# Patient Record
Sex: Male | Born: 1935 | Race: White | Hispanic: No | Marital: Married | Smoking: Never smoker
Health system: Southern US, Community
[De-identification: ages and names within clinical notes are randomized; demographics above are authoritative.]

## PROBLEM LIST (undated history)

## (undated) DIAGNOSIS — I10 Essential (primary) hypertension: Secondary | ICD-10-CM

## (undated) HISTORY — PX: OTHER SURGICAL HISTORY: SHX169

## (undated) HISTORY — PX: HERNIA REPAIR: SHX51

## (undated) HISTORY — DX: Essential (primary) hypertension: I10

## (undated) HISTORY — PX: KIDNEY STONE SURGERY: SHX686

## (undated) HISTORY — PX: HEMORROIDECTOMY: SUR656

---

## 2015-11-08 ENCOUNTER — Encounter (HOSPITAL_BASED_OUTPATIENT_CLINIC_OR_DEPARTMENT_OTHER): Payer: Self-pay | Admitting: *Deleted

## 2015-11-08 ENCOUNTER — Emergency Department (HOSPITAL_BASED_OUTPATIENT_CLINIC_OR_DEPARTMENT_OTHER): Payer: Medicare Other

## 2015-11-08 ENCOUNTER — Emergency Department (HOSPITAL_BASED_OUTPATIENT_CLINIC_OR_DEPARTMENT_OTHER)
Admission: EM | Admit: 2015-11-08 | Discharge: 2015-11-08 | Disposition: A | Discharge status: Home / Self Care | Payer: Medicare Other | Attending: Emergency Medicine | Admitting: Emergency Medicine

## 2015-11-08 DIAGNOSIS — N200 Calculus of kidney: Secondary | ICD-10-CM | POA: Insufficient documentation

## 2015-11-08 DIAGNOSIS — R1030 Lower abdominal pain, unspecified: Secondary | ICD-10-CM | POA: Diagnosis present

## 2015-11-08 LAB — LIPASE, BLOOD: Lipase: 24 U/L (ref 11–51)

## 2015-11-08 LAB — URINE MICROSCOPIC-ADD ON

## 2015-11-08 LAB — COMPREHENSIVE METABOLIC PANEL
ALT: 23 U/L (ref 17–63)
ANION GAP: 7 (ref 5–15)
AST: 27 U/L (ref 15–41)
Albumin: 4.3 g/dL (ref 3.5–5.0)
Alkaline Phosphatase: 60 U/L (ref 38–126)
BILIRUBIN TOTAL: 1.1 mg/dL (ref 0.3–1.2)
BUN: 20 mg/dL (ref 6–20)
CO2: 26 mmol/L (ref 22–32)
Calcium: 9.2 mg/dL (ref 8.9–10.3)
Chloride: 105 mmol/L (ref 101–111)
Creatinine, Ser: 1.07 mg/dL (ref 0.61–1.24)
GLUCOSE: 127 mg/dL — AB (ref 65–99)
POTASSIUM: 3.8 mmol/L (ref 3.5–5.1)
Sodium: 138 mmol/L (ref 135–145)
TOTAL PROTEIN: 7.4 g/dL (ref 6.5–8.1)

## 2015-11-08 LAB — URINALYSIS, ROUTINE W REFLEX MICROSCOPIC
Bilirubin Urine: NEGATIVE
Glucose, UA: NEGATIVE mg/dL
Hgb urine dipstick: NEGATIVE
Ketones, ur: 15 mg/dL — AB
LEUKOCYTES UA: NEGATIVE
NITRITE: NEGATIVE
PH: 5.5 (ref 5.0–8.0)
Protein, ur: 30 mg/dL — AB
SPECIFIC GRAVITY, URINE: 1.028 (ref 1.005–1.030)

## 2015-11-08 LAB — CBC WITH DIFFERENTIAL/PLATELET
BASOS ABS: 0 10*3/uL (ref 0.0–0.1)
BASOS PCT: 0 %
EOS ABS: 0 10*3/uL (ref 0.0–0.7)
Eosinophils Relative: 0 %
HEMATOCRIT: 43.6 % (ref 39.0–52.0)
HEMOGLOBIN: 15.8 g/dL (ref 13.0–17.0)
Lymphocytes Relative: 5 %
Lymphs Abs: 0.7 10*3/uL (ref 0.7–4.0)
MCH: 31.1 pg (ref 26.0–34.0)
MCHC: 36.2 g/dL — AB (ref 30.0–36.0)
MCV: 85.8 fL (ref 78.0–100.0)
MONOS PCT: 3 %
Monocytes Absolute: 0.5 10*3/uL (ref 0.1–1.0)
NEUTROS ABS: 14 10*3/uL — AB (ref 1.7–7.7)
NEUTROS PCT: 92 %
Platelets: 272 10*3/uL (ref 150–400)
RBC: 5.08 MIL/uL (ref 4.22–5.81)
RDW: 13.5 % (ref 11.5–15.5)
WBC: 15.2 10*3/uL — AB (ref 4.0–10.5)

## 2015-11-08 MED ORDER — SODIUM CHLORIDE 0.9 % IV SOLN
INTRAVENOUS | Status: DC
  Administered 2015-11-08: 10:00:00 via INTRAVENOUS

## 2015-11-08 MED ORDER — METHYLPREDNISOLONE SODIUM SUCC 125 MG IJ SOLR
125.0000 mg | Freq: Once | INTRAMUSCULAR | Status: AC
  Administered 2015-11-08: 125 mg via INTRAVENOUS
  Filled 2015-11-08: qty 2

## 2015-11-08 MED ORDER — IOPAMIDOL (ISOVUE-300) INJECTION 61%
100.0000 mL | Freq: Once | INTRAVENOUS | Status: AC | PRN
  Administered 2015-11-08: 100 mL via INTRAVENOUS

## 2015-11-08 MED ORDER — HYDROCODONE-ACETAMINOPHEN 5-325 MG PO TABS: 1.0000 | ORAL_TABLET | ORAL | Status: DC | PRN

## 2015-11-08 MED ORDER — DIPHENHYDRAMINE HCL 50 MG/ML IJ SOLN
25.0000 mg | Freq: Once | INTRAMUSCULAR | Status: AC
  Administered 2015-11-08: 25 mg via INTRAVENOUS
  Filled 2015-11-08: qty 1

## 2015-11-08 NOTE — Discharge Instructions (Signed)
Dietary Guidelines to Help Prevent Kidney Stones °Your risk of kidney stones can be decreased by adjusting the foods you eat. The most important thing you can do is drink enough fluid. You should drink enough fluid to keep your urine clear or pale yellow. The following guidelines provide specific information for the type of kidney stone you have had. °GUIDELINES ACCORDING TO TYPE OF KIDNEY STONE °Calcium Oxalate Kidney Stones °· Reduce the amount of salt you eat. Foods that have a lot of salt cause your body to release excess calcium into your urine. The excess calcium can combine with a substance called oxalate to form kidney stones. °· Reduce the amount of animal protein you eat if the amount you eat is excessive. Animal protein causes your body to release excess calcium into your urine. Ask your dietitian how much protein from animal sources you should be eating. °· Avoid foods that are high in oxalates. If you take vitamins, they should have less than 500 mg of vitamin C. Your body turns vitamin C into oxalates. You do not need to avoid fruits and vegetables high in vitamin C. °Calcium Phosphate Kidney Stones °· Reduce the amount of salt you eat to help prevent the release of excess calcium into your urine. °· Reduce the amount of animal protein you eat if the amount you eat is excessive. Animal protein causes your body to release excess calcium into your urine. Ask your dietitian how much protein from animal sources you should be eating. °· Get enough calcium from food or take a calcium supplement (ask your dietitian for recommendations). Food sources of calcium that do not increase your risk of kidney stones include: °¨ Broccoli. °¨ Dairy products, such as cheese and yogurt. °¨ Pudding. °Uric Acid Kidney Stones °· Do not have more than 6 oz of animal protein per day. °FOOD SOURCES °Animal Protein Sources °· Meat (all types). °· Poultry. °· Eggs. °· Fish, seafood. °Foods High in Salt °· Salt seasonings. °· Soy  sauce. °· Teriyaki sauce. °· Cured and processed meats. °· Salted crackers and snack foods. °· Fast food. °· Canned soups and most canned foods. °Foods High in Oxalates °· Grains: °¨ Amaranth. °¨ Barley. °¨ Grits. °¨ Wheat germ. °¨ Bran. °¨ Buckwheat flour. °¨ All bran cereals. °¨ Pretzels. °¨ Whole wheat bread. °· Vegetables: °¨ Beans (wax). °¨ Beets and beet greens. °¨ Collard greens. °¨ Eggplant. °¨ Escarole. °¨ Leeks. °¨ Okra. °¨ Parsley. °¨ Rutabagas. °¨ Spinach. °¨ Swiss chard. °¨ Tomato paste. °¨ Fried potatoes. °¨ Sweet potatoes. °· Fruits: °¨ Red currants. °¨ Figs. °¨ Kiwi. °¨ Rhubarb. °· Meat and Other Protein Sources: °¨ Beans (dried). °¨ Soy burgers and other soybean products. °¨ Miso. °¨ Nuts (peanuts, almonds, pecans, cashews, hazelnuts). °¨ Nut butters. °¨ Sesame seeds and tahini (paste made of sesame seeds). °¨ Poppy seeds. °· Beverages: °¨ Chocolate drink mixes. °¨ Soy milk. °¨ Instant iced tea. °¨ Juices made from high-oxalate fruits or vegetables. °· Other: °¨ Carob. °¨ Chocolate. °¨ Fruitcake. °¨ Marmalades. °  °This information is not intended to replace advice given to you by your health care provider. Make sure you discuss any questions you have with your health care provider. °  °Document Released: 10/11/2010 Document Revised: 06/21/2013 Document Reviewed: 05/13/2013 °Elsevier Interactive Patient Education ©2016 Elsevier Inc. ° ° °Kidney Stones °Kidney stones (urolithiasis) are deposits that form inside your kidneys. The intense pain is caused by the stone moving through the urinary tract. When the stone moves, the   ureter goes into spasm around the stone. The stone is usually passed in the urine.  °CAUSES  °· A disorder that makes certain neck glands produce too much parathyroid hormone (primary hyperparathyroidism). °· A buildup of uric acid crystals, similar to gout in your joints. °· Narrowing (stricture) of the ureter. °· A kidney obstruction present at birth (congenital  obstruction). °· Previous surgery on the kidney or ureters. °· Numerous kidney infections. °SYMPTOMS  °· Feeling sick to your stomach (nauseous). °· Throwing up (vomiting). °· Blood in the urine (hematuria). °· Pain that usually spreads (radiates) to the groin. °· Frequency or urgency of urination. °DIAGNOSIS  °· Taking a history and physical exam. °· Blood or urine tests. °· CT scan. °· Occasionally, an examination of the inside of the urinary bladder (cystoscopy) is performed. °TREATMENT  °· Observation. °· Increasing your fluid intake. °· Extracorporeal shock wave lithotripsy--This is a noninvasive procedure that uses shock waves to break up kidney stones. °· Surgery may be needed if you have severe pain or persistent obstruction. There are various surgical procedures. Most of the procedures are performed with the use of small instruments. Only small incisions are needed to accommodate these instruments, so recovery time is minimized. °The size, location, and chemical composition are all important variables that will determine the proper choice of action for you. Talk to your health care provider to better understand your situation so that you will minimize the risk of injury to yourself and your kidney.  °HOME CARE INSTRUCTIONS  °· Drink enough water and fluids to keep your urine clear or pale yellow. This will help you to pass the stone or stone fragments. °· Strain all urine through the provided strainer. Keep all particulate matter and stones for your health care provider to see. The stone causing the pain may be as small as a grain of salt. It is very important to use the strainer each and every time you pass your urine. The collection of your stone will allow your health care provider to analyze it and verify that a stone has actually passed. The stone analysis will often identify what you can do to reduce the incidence of recurrences. °· Only take over-the-counter or prescription medicines for pain,  discomfort, or fever as directed by your health care provider. °· Keep all follow-up visits as told by your health care provider. This is important. °· Get follow-up X-rays if required. The absence of pain does not always mean that the stone has passed. It may have only stopped moving. If the urine remains completely obstructed, it can cause loss of kidney function or even complete destruction of the kidney. It is your responsibility to make sure X-rays and follow-ups are completed. Ultrasounds of the kidney can show blockages and the status of the kidney. Ultrasounds are not associated with any radiation and can be performed easily in a matter of minutes. °· Make changes to your daily diet as told by your health care provider. You may be told to: °¨ Limit the amount of salt that you eat. °¨ Eat 5 or more servings of fruits and vegetables each day. °¨ Limit the amount of meat, poultry, fish, and eggs that you eat. °· Collect a 24-hour urine sample as told by your health care provider. You may need to collect another urine sample every 6-12 months. °SEEK MEDICAL CARE IF: °· You experience pain that is progressive and unresponsive to any pain medicine you have been prescribed. °SEEK IMMEDIATE MEDICAL CARE IF:  °·   Pain cannot be controlled with the prescribed medicine. °· You have a fever or shaking chills. °· The severity or intensity of pain increases over 18 hours and is not relieved by pain medicine. °· You develop a new onset of abdominal pain. °· You feel faint or pass out. °· You are unable to urinate. °  °This information is not intended to replace advice given to you by your health care provider. Make sure you discuss any questions you have with your health care provider. °  °Document Released: 06/16/2005 Document Revised: 03/07/2015 Document Reviewed: 11/17/2012 °Elsevier Interactive Patient Education ©2016 Elsevier Inc. ° °

## 2015-11-08 NOTE — ED Notes (Signed)
Pt wants CD burned for his other MD. Xray called. Pt will be discharged upon CD arrival to room.

## 2015-11-08 NOTE — ED Provider Notes (Signed)
CSN: [id number]     Arrival date & time 11/08/15  0827 History   First MD Initiated Contact with Patient 11/08/15 [phone]     No chief complaint on file.    (Consider location/radiation/quality/duration/timing/severity/associated sxs/prior Treatment) HPI Patient developed abdominal pain throughout his lower abdomen during the night. It awakened him and was quite severe. It was waxing and waning in severity. Worse with movement. Patient became nauseated but did not vomit. He reports normal bowel movements without recent constipation. No pain burning or urgency. Urination no blood in the urine. Never had similar pain before. He has had history of inguinal hernia repairs. Patient has refused screening colonoscopies. History reviewed. No pertinent past medical history. History reviewed. No pertinent past surgical history. No family history on file. Social History  Substance Use Topics  . Smoking status: Never Smoker   . Smokeless tobacco: None  . Alcohol Use: None    Review of Systems 10 Systems reviewed and are negative for acute change except as noted in the HPI.    Allergies  Betadine  Home Medications   Prior to Admission medications   Medication Sig Start Date End Date Taking? Authorizing Provider  HYDROcodone-acetaminophen (NORCO/VICODIN) 5-325 MG tablet Take 1-2 tablets by mouth every 4 (four) hours as needed for moderate pain or severe pain. 11/08/15   Arby BarretteMarcy Seidy Labreck, MD   BP 153/86 mmHg  Pulse 72  Temp(Src) 98.6 F (37 C) (Oral)  Resp 18  Ht 5\' 5"  (1.651 m)  Wt 145 lb (65.772 kg)  BMI 24.13 kg/m2  SpO2 97% Physical Exam  Constitutional: He is oriented to person, place, and time. He appears well-developed and well-nourished.  HENT:  Head: Normocephalic and atraumatic.  Eyes: EOM are normal. Pupils are equal, round, and reactive to light.  Neck: Neck supple.  Cardiovascular: Normal rate, regular rhythm, normal heart sounds and intact distal pulses.   Pulmonary/Chest:  Effort normal and breath sounds normal.  Abdominal: Soft. Bowel sounds are normal. He exhibits no distension. There is no tenderness.  Musculoskeletal: Normal range of motion. He exhibits no edema.  Neurological: He is alert and oriented to person, place, and time. He has normal strength. Coordination normal. GCS eye subscore is 4. GCS verbal subscore is 5. GCS motor subscore is 6.  Skin: Skin is warm, dry and intact.  Psychiatric: He has a normal mood and affect.    ED Course  Procedures (including critical care time) Labs Review Labs Reviewed  COMPREHENSIVE METABOLIC PANEL - Abnormal; Notable for the following:    Glucose, Bld 127 (*)    All other components within normal limits  CBC WITH DIFFERENTIAL/PLATELET - Abnormal; Notable for the following:    WBC 15.2 (*)    MCHC 36.2 (*)    Neutro Abs 14.0 (*)    All other components within normal limits  URINALYSIS, ROUTINE W REFLEX MICROSCOPIC (NOT AT [facility]) - Abnormal; Notable for the following:    Ketones, ur 15 (*)    Protein, ur 30 (*)    All other components within normal limits  URINE MICROSCOPIC-ADD ON - Abnormal; Notable for the following:    Squamous Epithelial / LPF 0-5 (*)    Bacteria, UA MANY (*)    Casts HYALINE CASTS (*)    All other components within normal limits  LIPASE, BLOOD    Imaging Review Ct Abdomen Pelvis W Contrast  11/08/2015  CLINICAL DATA:  Mid abdominal pain for 1 day EXAM: CT ABDOMEN AND PELVIS WITH CONTRAST TECHNIQUE: Multidetector  CT imaging of the abdomen and pelvis was performed using the standard protocol following bolus administration of intravenous contrast. CONTRAST:  ISOVUE-300 IOPAMIDOL (ISOVUE-300) INJECTION 61% COMPARISON:  None. FINDINGS: Lung bases are free of acute infiltrate or sizable effusion. The liver is well visualized and demonstrates at least 3 areas of decreased attenuation and demonstrate nodular peripheral enhancement consistent with hemangiomas. No other focal hepatic lesion  is seen. The spleen, adrenal glands and pancreas are within normal limits. The kidneys are well visualized and demonstrates cystic change on the left. This cyst measures approximately 2 cm. There is fullness of the right renal collecting system increased perinephric stranding which extends into the mid right ureter. There a is a 5 mm stone best seen on image number 49 of series 2 and image number 43 of series 5. Bladder is incompletely distended but poorly evaluated due to considerable beam hardening artifact from bilateral hip replacements. The appendix is well visualized and within normal limits. No inflammatory changes are seen. Minimal diverticular change is noted without evidence of diverticulitis. No acute bony abnormality is noted. IMPRESSION: 5 mm mid right ureteral stone with obstructive change. Multiple peripherally enhancing lesions within the liver consistent with hemangiomas. Chronic changes as described above. Electronically Signed   By: Alcide Clever M.D.   On: 11/08/2015 11:09   I have personally reviewed and evaluated these images and lab results as part of my medical decision-making.   EKG Interpretation None      MDM   Final diagnoses:  Kidney stone   Patient has remained pain-free in the emergency department. Patient has history of prior kidney stone. He will be given Vicodin to take as needed for pain. He reports he has tolerated this in the past without difficulty. Follow-up with his urologist.    Arby Barrette, MD 11/08/15 1126

## 2015-11-08 NOTE — ED Notes (Signed)
Strainer given to pt

## 2015-11-08 NOTE — ED Notes (Signed)
C/o all across abd pain. Nausea but no v/d. Pain is constant but intensifies at times.  Last NL BM was yesterday am.

## 2017-07-29 IMAGING — CT CT ABD-PELV W/ CM
2 of 5 series · 16 of 46 positions shown, 18 images · IV contrast (APPLIED)
Comparison: None.

CLINICAL DATA: Mid abdominal pain for 1 day

EXAM:
CT ABDOMEN AND PELVIS WITH CONTRAST
TECHNIQUE: Multidetector CT imaging of the abdomen and pelvis was performed
using the standard protocol following bolus administration of
intravenous contrast.
CONTRAST:  100mL C9XKSF-OSS IOPAMIDOL (C9XKSF-OSS) INJECTION 61%

[Series 2: axial st · axial · 0.86mm/px · z∈[-462,-36]mm · 13 of 96 slices shown, 15 images]
[im 6/96  soft-tissue]
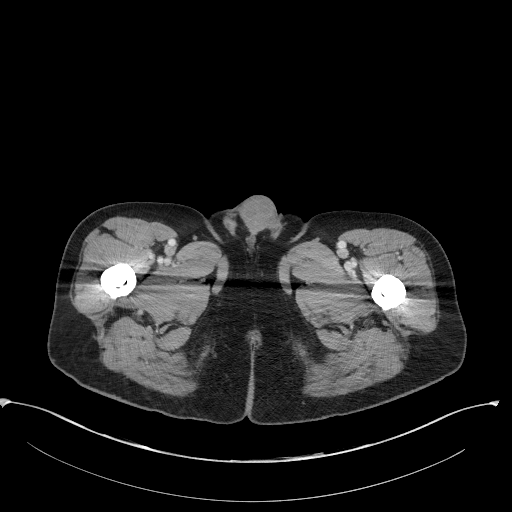
[im 6/96  bone]
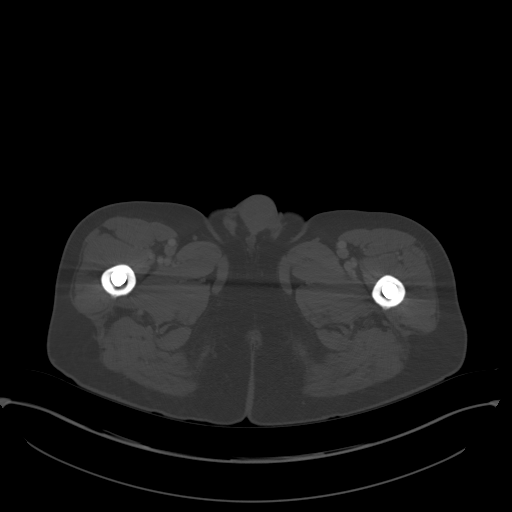
[im 16/96  soft-tissue]
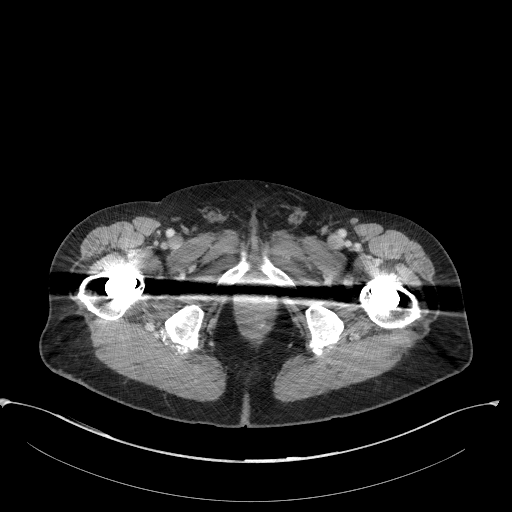
[im 21/96  soft-tissue]
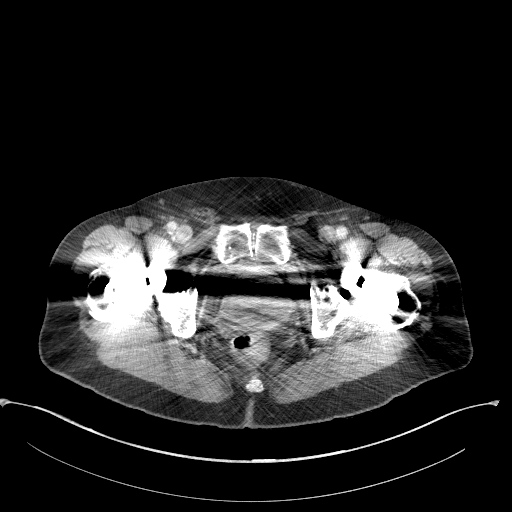
[im 26/96  soft-tissue]
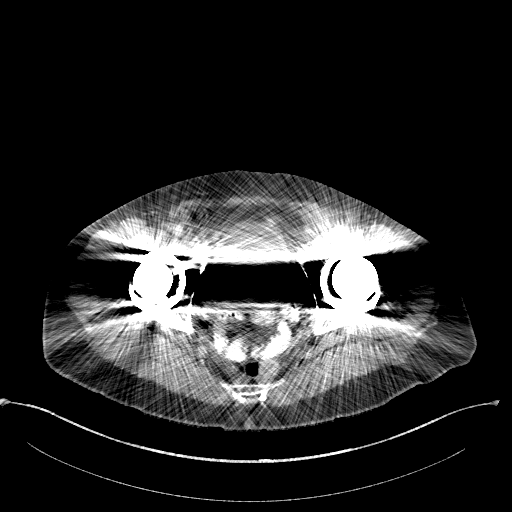
[im 36/96  soft-tissue]
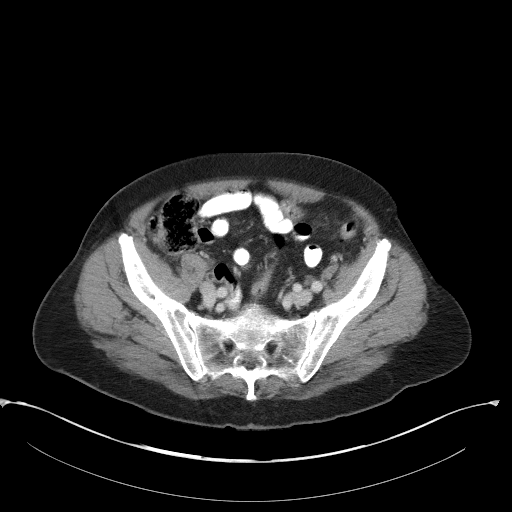
[im 41/96  soft-tissue]
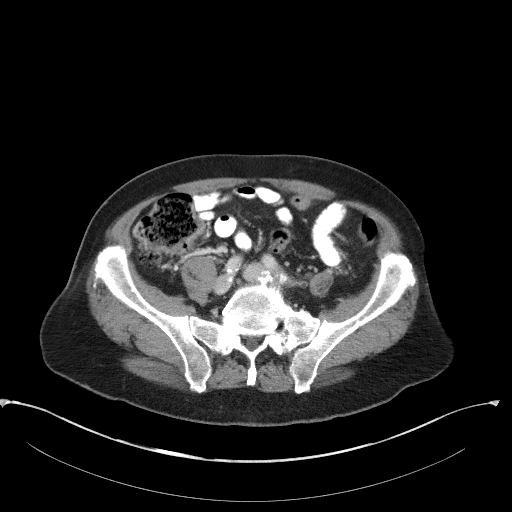
[im 51/96  soft-tissue]
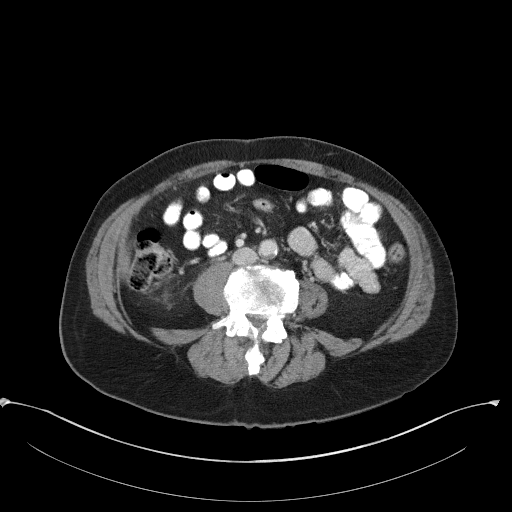
[im 56/96  soft-tissue]
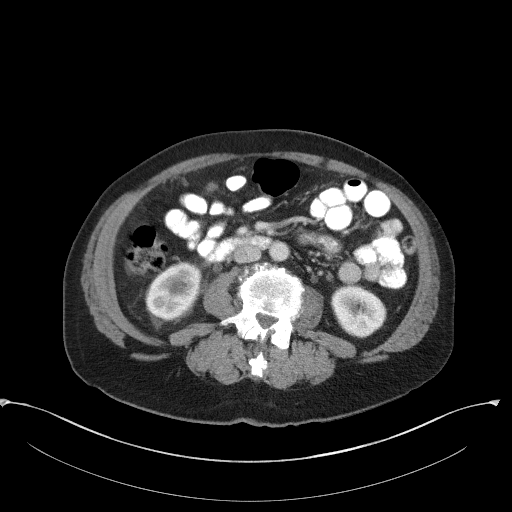
[im 61/96  soft-tissue]
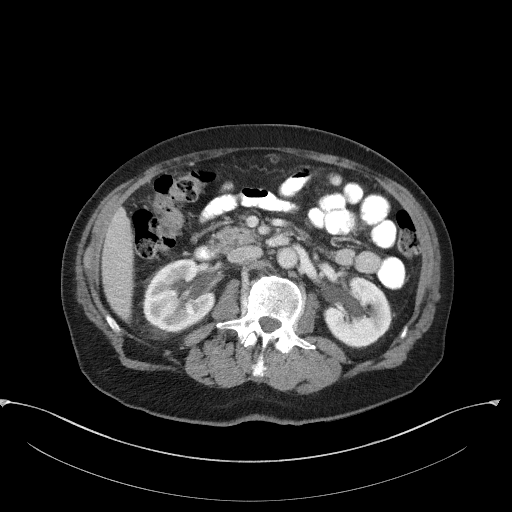
[im 61/96  bone]
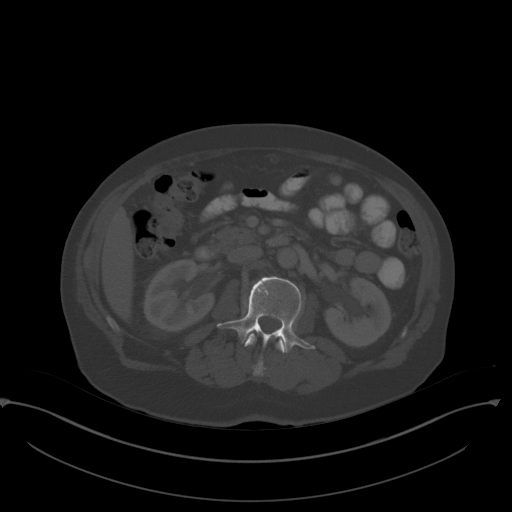
[im 71/96  soft-tissue]
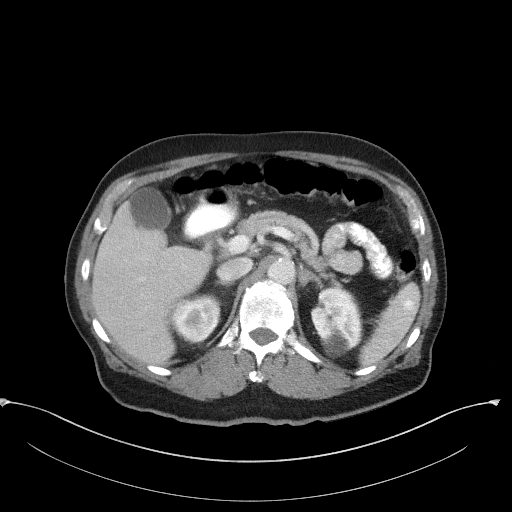
[im 76/96  soft-tissue]
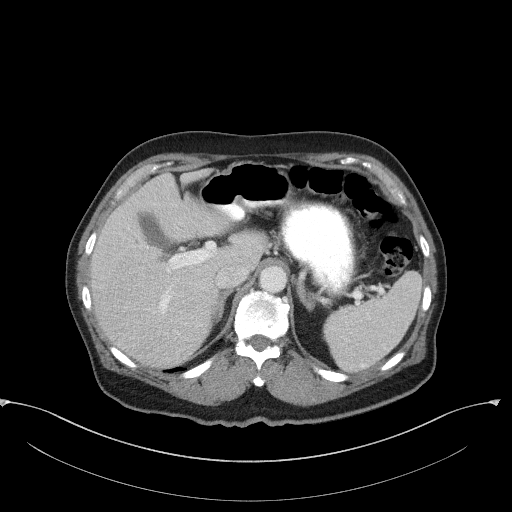
[im 81/96  soft-tissue]
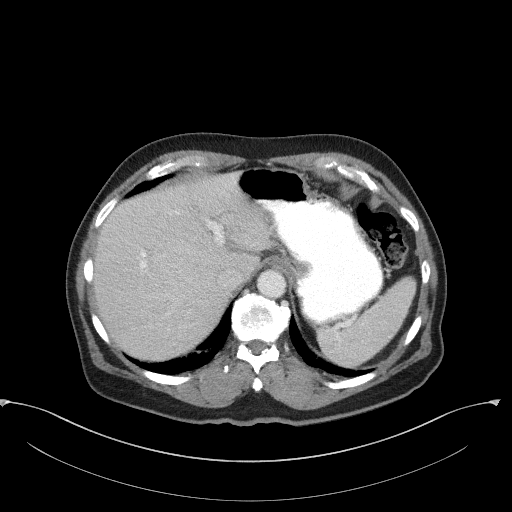
[im 91/96  soft-tissue]
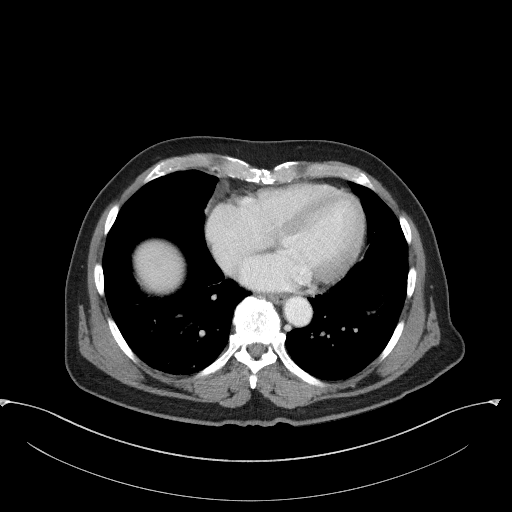

[Series 5: coronal st · coronal · 0.74mm/px · 3 of 92 slices shown]
[im 31/92  soft-tissue]
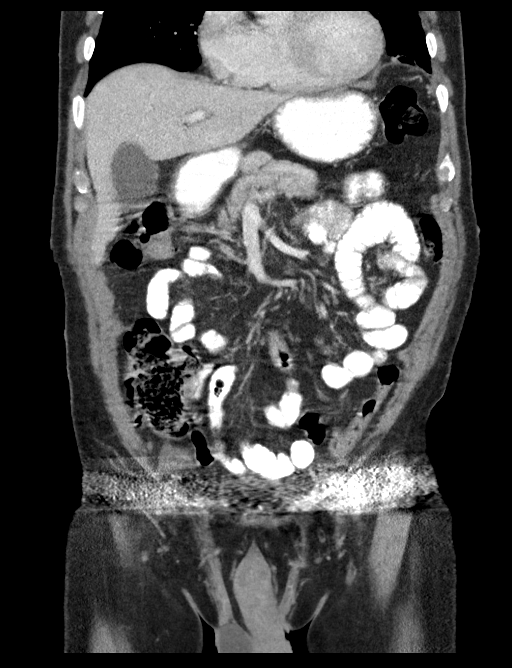
[im 41/92  soft-tissue]
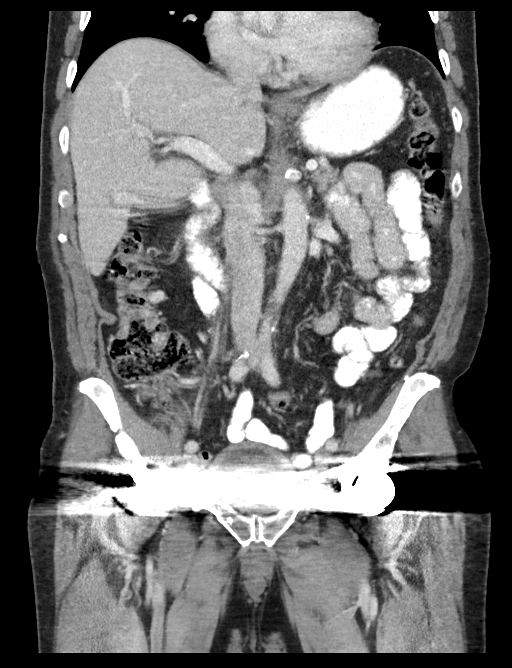
[im 51/92  soft-tissue]
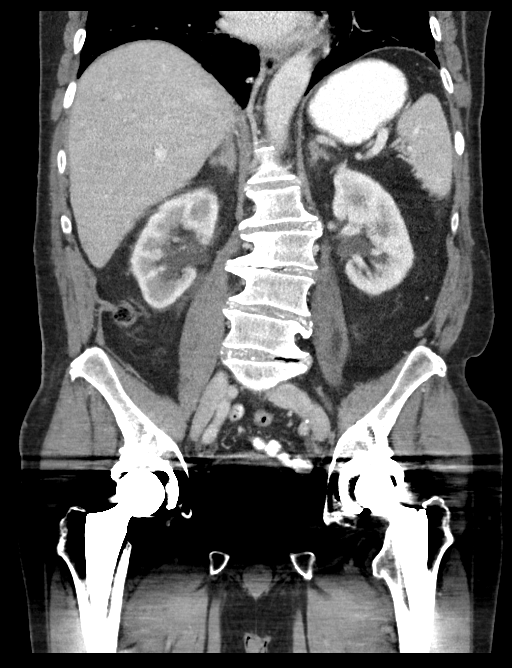

[16 of 46 positions shown; findings below may reference images not displayed]

FINDINGS: Lung bases are free of acute infiltrate or sizable effusion.

The liver is well visualized and demonstrates at least 3 areas of
decreased attenuation and demonstrate nodular peripheral enhancement
consistent with hemangiomas. No other focal hepatic lesion is seen.
The spleen, adrenal glands and pancreas are within normal limits.
The kidneys are well visualized and demonstrates cystic change on
the left. This cyst measures approximately 2 cm. There is fullness
of the right renal collecting system increased perinephric stranding
which extends into the mid right ureter. There a is a 5 mm stone
best seen on image number 49 of series 2 and image number 43 of
series 5.

Bladder is incompletely distended but poorly evaluated due to
considerable beam hardening artifact from bilateral hip
replacements. The appendix is well visualized and within normal
limits. No inflammatory changes are seen. Minimal diverticular
change is noted without evidence of diverticulitis. No acute bony
abnormality is noted.
IMPRESSION: 5 mm mid right ureteral stone with obstructive change.

Multiple peripherally enhancing lesions within the liver consistent
with hemangiomas.

Chronic changes as described above.

## 2018-02-25 ENCOUNTER — Encounter: Payer: Self-pay | Admitting: Neurology

## 2018-02-25 ENCOUNTER — Ambulatory Visit (INDEPENDENT_AMBULATORY_CARE_PROVIDER_SITE_OTHER): Payer: Medicare Other | Admitting: Neurology

## 2018-02-25 VITALS — BP 102/62 | HR 56 | Ht 66.0 in | Wt 151.6 lb

## 2018-02-25 DIAGNOSIS — G2581 Restless legs syndrome: Secondary | ICD-10-CM

## 2018-02-25 DIAGNOSIS — I6381 Other cerebral infarction due to occlusion or stenosis of small artery: Secondary | ICD-10-CM | POA: Diagnosis not present

## 2018-02-25 MED ORDER — GABAPENTIN 300 MG PO CAPS: 300.0000 mg | ORAL_CAPSULE | Freq: Every day | ORAL | 11 refills | Status: AC

## 2018-02-25 NOTE — Progress Notes (Signed)
[facility] [address] [place] [id number] [phone]       OFFICE CONSULT NOTE  Samuel. Samuel Moody Date of Birth:  07/11/1934 Medical Record Number:  [id number]   Referring MD:  Forrest MoronStephen Ruehle  Reason for Referral:  stroke  HPI: Samuel Moody is a pleasant 83 year old Caucasian male seen today for initial office consultation visit following was stroke in July 2019. He is accompanied by his wife today. History is obtained from them and review of referral notes, electronic medical records and I personally reviewed imaging films in PACS. He states that he developed sudden onset of slurred speech and right upper extremity weakness and numbness when he woke up from sleep. His wife is aware that day but eventually got back later in the days to call the primary care physician with bilateral patient and sent [facility] to [facility] where he was admitted. MRI scan of the brain done on 01/04/18 personally reviewed shows left basal ganglia lacunar infarct. There are mild changes of chronic microvascular ischemia.CT angiogram of the brain and neck both did not reveal any significant large vessel stenosis or occlusion. It is unclear if echocardiogram was done as I do not have the report. Lipid profile was apparently elevated. Patient was started on aspirin to 25 mg daily as well as Lipitor and blood pressure medicines. Patient states his done well since discharge. He still has occasional speech difficulties when he tries to talk fast. Regain most of his strength back the entirety feels right arm feels little heavy. He is tolerating Lipitor well without muscle aches or side effects. He does complain of restless legs which is had for several years. He has a prescription of Requip 0.25 mg which he does not take too often and feels it doesn't help as well. He has not tried any other medications for restless legs. He denies any other prior history of strokes TIAs seizures and migraines. He has no  family history of strokes. He has no new neurological complaints today  ROS:   14 system review of systems is positive for  Easy bruising, cramps, neck pain, restless legs, runny nose, skin sensitivity, weakness, slurred speech, insomnia and all other systems negative  PMH:  Past Medical History:  Diagnosis Date  . Hypertension     Social History:  Social History   Socioeconomic History  . Marital status: Married    Spouse name: Not on file  . Number of children: Not on file  . Years of education: Not on file  . Highest education level: Not on file  Occupational History  . Not on file  Social Needs  . Financial resource strain: Not on file  . Food insecurity:    Worry: Not on file    Inability: Not on file  . Transportation needs:    Medical: Not on file    Non-medical: Not on file  Tobacco Use  . Smoking status: Never Smoker  . Smokeless tobacco: Never Used  Substance and Sexual Activity  . Alcohol use: Not Currently  . Drug use: Not Currently  . Sexual activity: Not on file  Lifestyle  . Physical activity:    Days per week: Not on file    Minutes per session: Not on file  . Stress: Not on file  Relationships  . Social connections:    Talks on phone: Not on file    Gets together: Not on file    Attends religious service: Not on file  Active member of club or organization: Not on file    Attends meetings of clubs or organizations: Not on file    Relationship status: Not on file  . Intimate partner violence:    Fear of current or ex partner: Not on file    Emotionally abused: Not on file    Physically abused: Not on file    Forced sexual activity: Not on file  Other Topics Concern  . Not on file  Social History Narrative  . Not on file    Medications:   Current Outpatient Medications on File Prior to Visit  Medication Sig Dispense Refill  . atorvastatin (LIPITOR) 40 MG tablet Take by mouth.    Burman Blacksmith ASPIRIN ADULT LOW DOSE 81 MG EC tablet Take 81 mg by  mouth daily.  0  . hydrochlorothiazide (HYDRODIURIL) 25 MG tablet Take 25 mg by mouth daily.  5  . lisinopril (PRINIVIL,ZESTRIL) 20 MG tablet Take by mouth.    Marland Kitchen LORazepam (ATIVAN) 1 MG tablet Take 1 mg by mouth at bedtime.  5   No current facility-administered medications on file prior to visit.     Allergies:   Allergies  Allergen Reactions  . Betadine [Povidone Iodine] Rash    Physical Exam General: well developed, well nourished elderly Caucasian male, seated, in no evident distress Head: head normocephalic and atraumatic.   Neck: supple with no carotid or supraclavicular bruits Cardiovascular: regular rate and rhythm, no murmurs Musculoskeletal: no deformity Skin:  no rash/petichiae Vascular:  Normal pulses all extremities  Neurologic Exam Mental Status: Awake and fully alert. Oriented to place and time. Recent and remote memory intact. Attention span, concentration and fund of knowledge appropriate. Mood and affect appropriate.  Cranial Nerves: Fundoscopic exam reveals sharp disc margins. Pupils equal, briskly reactive to light. Extraocular movements full without nystagmus. Visual fields full to confrontation. Hearing intact. Facial sensation intact. Face, tongue, palate moves normally and symmetrically.  Motor: Normal bulk and tone. Normal strength in all tested extremity muscles. Sensory.: intact to touch , pinprick , position and vibratory sensation.  Coordination: Rapid alternating movements normal in all extremities. Finger-to-nose and heel-to-shin performed accurately bilaterally. Gait and Station: Arises from chair without difficulty. Stance is normal. Gait demonstrates normal stride length and balance . Able to heel, toe and tandem walk with mild  difficulty.  Reflexes: 1+ and symmetric. Toes downgoing.   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 Vascular risk  factors of hypertension hyperlipidemia and age. He also has restless leg syndrome.     PLAN: I had a long d/w patient and his wife about his recent lacunar stroke, risk for recurrent stroke/TIAs, personally independently reviewed imaging studies and stroke evaluation results and answered questions.Continue aspirin 325 mg daily  for secondary stroke prevention and maintain strict control of hypertension with blood pressure goal below 130/90, diabetes with hemoglobin A1c goal below 6.5% and lipids with LDL cholesterol goal below 70 mg/dL. I also advised the patient to eat a healthy diet with plenty of whole grains, cereals, fruits and vegetables, exercise regularly and maintain ideal body weight. And recommend he try increasing the Requip 2.5 mg at night for his restless leg syndrome and if this is not effective to stop it and try gabapentin 300 mg at night instead. Have discussed possible side effects with him. He may also consider possible participation in the [facility] stroke  prevention  trial if interest.greater than 50% time during this 45 minute consultation visit was spent on counseling and coordination of care about his the corner infarct and restless leg syndrome.He will return for follow-up in 3 months with my nurse practitioner Shanda Bumps for call earlier if necessary  Delia Heady, MD  [facility] [address] [place], [place] [address]  Phone [phone] Fax [phone] Note: This document was prepared with digital dictation and possible smart phrase technology. Any transcriptional errors that result from this process are unintentional.

## 2018-02-25 NOTE — Patient Instructions (Signed)
I had a long d/w patient and his wife about his recent lacunar stroke, risk for recurrent stroke/TIAs, personally independently reviewed imaging studies and stroke evaluation results and answered questions.Continue aspirin 325 mg daily  for secondary stroke prevention and maintain strict control of hypertension with blood pressure goal below 130/90, diabetes with hemoglobin A1c goal below 6.5% and lipids with LDL cholesterol goal below 70 mg/dL. I also advised the patient to eat a healthy diet with plenty of whole grains, cereals, fruits and vegetables, exercise regularly and maintain ideal body weight. And recommend he try increasing the Requip 2.5 mg at night for his restless leg syndrome and if this is not effective to stop it and try gabapentin 300 mg at night instead. Have discussed possible side effects with him. He may also consider possible participation in the [facility] stroke prevention  trial if interestHe will return for follow-up in 3 months with my nurse practitioner Shanda Bumps for call earlier if necessary  Stroke Prevention Some medical conditions and behaviors are associated with a higher chance of having a stroke. You can help prevent a stroke by making nutrition, lifestyle, and other changes, including managing any medical conditions you may have. What nutrition changes can be made?  Eat healthy foods. You can do this by: ? Choosing foods high in fiber, such as fresh fruits and vegetables and whole grains. ? Eating at least 5 or more servings of fruits and vegetables a day. Try to fill half of your plate at each meal with fruits and vegetables. ? Choosing lean protein foods, such as lean cuts of meat, poultry without skin, fish, tofu, beans, and nuts. ? Eating low-fat dairy products. ? Avoiding foods that are high in salt (sodium). This can help lower blood pressure. ? Avoiding foods that have saturated fat, trans fat, and cholesterol. This can help prevent high cholesterol. ? Avoiding  processed and premade foods.  Follow your health care provider's specific guidelines for losing weight, controlling high blood pressure (hypertension), lowering high cholesterol, and managing diabetes. These may include: ? Reducing your daily calorie intake. ? Limiting your daily sodium intake to 1,500 milligrams (mg). ? Using only healthy fats for cooking, such as olive oil, canola oil, or sunflower oil. ? Counting your daily carbohydrate intake. What lifestyle changes can be made?  Maintain a healthy weight. Talk to your health care provider about your ideal weight.  Get at least 30 minutes of moderate physical activity at least 5 days a week. Moderate activity includes brisk walking, biking, and swimming.  Do not use any products that contain nicotine or tobacco, such as cigarettes and e-cigarettes. If you need help quitting, ask your health care provider. It may also be helpful to avoid exposure to secondhand smoke.  Limit alcohol intake to no more than 1 drink a day for nonpregnant women and 2 drinks a day for men. One drink equals 12 oz of beer, 5 oz of wine, or 1 oz of hard liquor.  Stop any illegal drug use.  Avoid taking birth control pills. Talk to your health care provider about the risks of taking birth control pills if: ? You are over 25 years old. ? You smoke. ? You get migraines. ? You have ever had a blood clot. What other changes can be made?  Manage your cholesterol levels. ? Eating a healthy diet is important for preventing high cholesterol. If cholesterol cannot be managed through diet alone, you may also need to take medicines. ? Take any prescribed medicines  to control your cholesterol as told by your health care provider.  Manage your diabetes. ? Eating a healthy diet and exercising regularly are important parts of managing your blood sugar. If your blood sugar cannot be managed through diet and exercise, you may need to take medicines. ? Take any prescribed  medicines to control your diabetes as told by your health care provider.  Control your hypertension. ? To reduce your risk of stroke, try to keep your blood pressure below 130/80. ? Eating a healthy diet and exercising regularly are an important part of controlling your blood pressure. If your blood pressure cannot be managed through diet and exercise, you may need to take medicines. ? Take any prescribed medicines to control hypertension as told by your health care provider. ? Ask your health care provider if you should monitor your blood pressure at home. ? Have your blood pressure checked every year, even if your blood pressure is normal. Blood pressure increases with age and some medical conditions.  Get evaluated for sleep disorders (sleep apnea). Talk to your health care provider about getting a sleep evaluation if you snore a lot or have excessive sleepiness.  Take over-the-counter and prescription medicines only as told by your health care provider. Aspirin or blood thinners (antiplatelets or anticoagulants) may be recommended to reduce your risk of forming blood clots that can lead to stroke.  Make sure that any other medical conditions you have, such as atrial fibrillation or atherosclerosis, are managed. What are the warning signs of a stroke? The warning signs of a stroke can be easily remembered as BEFAST.  B is for balance. Signs include: ? Dizziness. ? Loss of balance or coordination. ? Sudden trouble walking.  E is for eyes. Signs include: ? A sudden change in vision. ? Trouble seeing.  F is for face. Signs include: ? Sudden weakness or numbness of the face. ? The face or eyelid drooping to one side.  A is for arms. Signs include: ? Sudden weakness or numbness of the arm, usually on one side of the body.  S is for speech. Signs include: ? Trouble speaking (aphasia). ? Trouble understanding.  T is for time. ? These symptoms may represent a serious problem that is  an emergency. Do not wait to see if the symptoms will go away. Get medical help right away. Call your local emergency services (911 in the [place] Do not drive yourself to the hospital.  Other signs of stroke may include: ? A sudden, severe headache with no known cause. ? Nausea or vomiting. ? Seizure.  Where to find more information: For more information, visit:  American Stroke Association: www.strokeassociation.org  National Stroke Association: www.stroke.org  Summary  You can prevent a stroke by eating healthy, exercising, not smoking, limiting alcohol intake, and managing any medical conditions you may have.  Do not use any products that contain nicotine or tobacco, such as cigarettes and e-cigarettes. If you need help quitting, ask your health care provider. It may also be helpful to avoid exposure to secondhand smoke.  Remember BEFAST for warning signs of stroke. Get help right away if you or a loved one has any of these signs. This information is not intended to replace advice given to you by your health care provider. Make sure you discuss any questions you have with your health care provider. Document Released: 07/24/2004 Document Revised: 07/22/2016 Document Reviewed: 07/22/2016 Elsevier Interactive Patient Education  2018 Elsevier Inc. Restless Legs Syndrome Restless legs syndrome is  a condition that causes uncomfortable feelings or sensations in the legs, especially while sitting or lying down. The sensations usually cause an overwhelming urge to move the legs. The arms can also sometimes be affected. The condition can range from mild to severe. The symptoms often interfere with a person's ability to sleep. What are the causes? The cause of this condition is not known. What increases the risk? This condition is more likely to develop in:  People who are older than age 81.  Pregnant women. In general, restless legs syndrome is more common in women than in men.  People  who have a family history of the condition.  People who have certain medical conditions, such as iron deficiency, kidney disease, Parkinson disease, or nerve damage.  People who take certain medicines, such as medicines for high blood pressure, nausea, colds, allergies, depression, and some heart conditions.  What are the signs or symptoms? The main symptom of this condition is uncomfortable sensations in the legs. These sensations may be:  Described as pulling, tingling, prickling, throbbing, crawling, or burning.  Worse while you are sitting or lying down.  Worse during periods of rest or inactivity.  Worse at night, often interfering with your sleep.  Accompanied by a very strong urge to move your legs.  Temporarily relieved by movement of your legs.  The sensations usually affect both sides of the body. The arms can also be affected, but this is rare. People who have this condition often have tiredness during the day because of their lack of sleep at night. How is this diagnosed? This condition may be diagnosed based on your description of the symptoms. You may also have tests, including blood tests, to check for other conditions that may lead to your symptoms. In some cases, you may be asked to spend some time in a sleep lab so your sleeping can be monitored. How is this treated? Treatment for this condition is focused on managing the symptoms. Treatment may include:  Self-help and lifestyle changes.  Medicines.  Follow these instructions at home:  Take medicines only as directed by your health care provider.  Try these methods to get temporary relief from the uncomfortable sensations: ? Massage your legs. ? Walk or stretch. ? Take a cold or hot bath.  Practice good sleep habits. For example, go to bed and get up at the same time every day.  Exercise regularly.  Practice ways of relaxing, such as yoga or meditation.  Avoid caffeine and alcohol.  Do not use any  tobacco products, including cigarettes, chewing tobacco, or electronic cigarettes. If you need help quitting, ask your health care provider.  Keep all follow-up visits as directed by your health care provider. This is important. Contact a health care provider if: Your symptoms do not improve with treatment, or they get worse. This information is not intended to replace advice given to you by your health care provider. Make sure you discuss any questions you have with your health care provider. Document Released: 06/06/2002 Document Revised: 11/22/2015 Document Reviewed: 06/12/2014 Elsevier Interactive Patient Education  [employer]2018 Elsevier Inc.

## 2018-06-14 ENCOUNTER — Ambulatory Visit (INDEPENDENT_AMBULATORY_CARE_PROVIDER_SITE_OTHER): Payer: Medicare Other | Admitting: Adult Health

## 2018-06-14 ENCOUNTER — Encounter: Payer: Self-pay | Admitting: Adult Health

## 2018-06-14 VITALS — BP 137/73 | HR 58 | Ht 66.0 in | Wt 155.0 lb

## 2018-06-14 DIAGNOSIS — E785 Hyperlipidemia, unspecified: Secondary | ICD-10-CM

## 2018-06-14 DIAGNOSIS — I10 Essential (primary) hypertension: Secondary | ICD-10-CM | POA: Diagnosis not present

## 2018-06-14 DIAGNOSIS — I6381 Other cerebral infarction due to occlusion or stenosis of small artery: Secondary | ICD-10-CM | POA: Diagnosis not present

## 2018-06-14 NOTE — Progress Notes (Signed)
[facility] [address] [place] [id number] [phone]       OFFICE CONSULT NOTE  Samuel. Samuel Moody Date of Birth:  06/10/1935 Medical Record Number:  [id number]   Referring MD:  Forrest MoronStephen Ruehle  Reason for Referral:  stroke  Chief Complaint  Patient presents with  . Follow-up    4 month follow up. Wife present. Rm 9. No new concerns at this time.      HPI initial visit 02/25/2018 PS: Samuel Moody is a pleasant 83 year old Caucasian male seen today for initial office consultation visit following was stroke in July 2019. He is accompanied by his wife today. History is obtained from them and review of referral notes, electronic medical records and I personally reviewed imaging films in PACS. He states that he developed sudden onset of slurred speech and right upper extremity weakness and numbness when he woke up from sleep. His wife is aware that day but eventually got back later in the days to call the primary care physician with bilateral patient and sent him to [facility] where he was admitted. MRI scan of the brain done on 01/04/18 personally reviewed shows left basal ganglia lacunar infarct. There are mild changes of chronic microvascular ischemia.CT angiogram of the brain and neck both did not reveal any significant large vessel stenosis or occlusion. It is unclear if echocardiogram was done as I do not have the report. Lipid profile was apparently elevated. Patient was started on aspirin 325 mg daily as well as Lipitor and blood pressure medicines. Patient states his done well since discharge. He still has occasional speech difficulties when he tries to talk fast. Regain most of his strength back the entirety feels right arm feels little heavy. He is tolerating Lipitor well without muscle aches or side effects. He does complain of restless legs which is had for several years. He has a prescription of Requip 0.25 mg which he does not take too often and feels it  doesn't help as well. He has not tried any other medications for restless legs. He denies any other prior history of strokes TIAs seizures and migraines. He has no family history of strokes. He has no new neurological complaints today  Interval history 06/14/2018: Patient is being seen today for 2656-month follow-up visit and is accompanied by his wife.  Overall, patient states he has been doing well with some occasional episodes of speech difficulties. Wife states he does have occasional slurred speech or slowing of saying his words but overall has made improvement. He has not continued with speech exercises but he does state he continues to stay active around [place]. He denies additional issues with right upper extremity as his prior weakness completely resolved. He continues on aspirin without side effects of bleeding or bruising.  Continues on atorvastatin without side effects myalgias.  Blood pressure today 137/73. He has had recent change to antihypertensives due to cough. He has been tolerating losartan well. He is currently on doxycycline for an ear infection which was started last week. He has since stopped taking Requip for his restless legs as he did not believe it was helping. He states recently he has not been having any issues with his restless legs recently. He did trial gabapentin but he was unable to tolerate. Denies new or worsening stroke/TIA symptoms.     ROS:   14 system review of systems is positive for hearing loss, ringing in ears, cough, restless leg  and joint pain and all other systems negative  PMH:  Past Medical History:  Diagnosis Date  . Hypertension     Social History:  Social History   Socioeconomic History  . Marital status: Married    Spouse name: Not on file  . Number of children: Not on file  . Years of education: Not on file  . Highest education level: Not on file  Occupational History  . Not on file  Social Needs  . Financial  resource strain: Not on file  . Food insecurity:    Worry: Not on file    Inability: Not on file  . Transportation needs:    Medical: Not on file    Non-medical: Not on file  Tobacco Use  . Smoking status: Never Smoker  . Smokeless tobacco: Never Used  Substance and Sexual Activity  . Alcohol use: Not Currently  . Drug use: Not Currently  . Sexual activity: Not on file  Lifestyle  . Physical activity:    Days per week: Not on file    Minutes per session: Not on file  . Stress: Not on file  Relationships  . Social connections:    Talks on phone: Not on file    Gets together: Not on file    Attends religious service: Not on file    Active member of club or organization: Not on file    Attends meetings of clubs or organizations: Not on file    Relationship status: Not on file  . Intimate partner violence:    Fear of current or ex partner: Not on file    Emotionally abused: Not on file    Physically abused: Not on file    Forced sexual activity: Not on file  Other Topics Concern  . Not on file  Social History Narrative  . Not on file    Medications:   Current Outpatient Medications on File Prior to Visit  Medication Sig Dispense Refill  . atorvastatin (LIPITOR) 40 MG tablet Take by mouth.    . ciprofloxacin (CILOXAN) 0.3 % ophthalmic solution Place 5 drops into the right ear 2 (two) times daily.    Marland Kitchen doxycycline (VIBRAMYCIN) 100 MG capsule Take 100 mg by mouth 2 (two) times daily.    Burman Blacksmith ASPIRIN ADULT LOW DOSE 81 MG EC tablet Take 81 mg by mouth daily.  0  . hydrochlorothiazide (HYDRODIURIL) 25 MG tablet Take 25 mg by mouth daily.  5  . losartan (COZAAR) 100 MG tablet Take 100 mg by mouth daily.    Marland Kitchen gabapentin (NEURONTIN) 300 MG capsule Take 1 capsule (300 mg total) by mouth at bedtime. (Patient not taking: Reported on 06/14/2018) 90 capsule 11  . LORazepam (ATIVAN) 1 MG tablet Take 1 mg by mouth at bedtime.  5   No current facility-administered medications on file  prior to visit.     Allergies:   Allergies  Allergen Reactions  . Betadine [Povidone Iodine] Rash   Vitals:   06/14/18 1029  Weight: 155 lb (70.3 kg)  Height: 5\' 6"  (1.676 m)    Physical Exam General: well developed, well nourished pleasant elderly Caucasian male, seated, in no evident distress Head: head normocephalic and atraumatic.   Neck: supple with no carotid or supraclavicular bruits Cardiovascular: regular rate and rhythm, no murmurs Musculoskeletal: no deformity Skin:  no rash/petichiae Vascular:  Normal pulses all extremities  Neurologic Exam Mental Status: Awake and fully alert. Oriented to place and time. Recent and remote memory  intact. Attention span, concentration and fund of knowledge appropriate. Mood and affect appropriate.  Cranial Nerves: Fundoscopic exam reveals sharp disc margins. Pupils equal, briskly reactive to light. Extraocular movements full without nystagmus. Visual fields full to confrontation. Hearing intact. Facial sensation intact. Face, tongue, palate moves normally and symmetrically.  Motor: Normal bulk and tone. Normal strength in all tested extremity muscles. Sensory.: intact to touch , pinprick , position and vibratory sensation.  Coordination: Rapid alternating movements normal in all extremities. Finger-to-nose and heel-to-shin performed accurately bilaterally. Gait and Station: Arises from chair without difficulty. Stance is normal. Gait demonstrates normal stride length and balance . Able to heel, toe and tandem walk with mild  difficulty.  Reflexes: 1+ and symmetric. Toes downgoing.      ASSESSMENT: 83 year old Caucasian male with left basal ganglia lacunar infarct in July 2019 due to small vessel disease. Vascular risk factors of hypertension hyperlipidemia and age. He also has restless leg syndrome.  Patient is being seen today for follow-up visit and overall doing well from a stroke standpoint with only residual intermittent episodes of  speech difficulties.     PLAN: -Advised to continue aspirin 81 mg and atorvastatin 40 mg daily for secondary stroke prevention -f/u with PCP for HLD and HTN management -Advised to continue stay active and maintain a healthy diet -Continue to monitor blood pressure at home -Advised patient to notify us or his PCP if he starts to experience worsening RLS symptoms -Maintain strict control of hypertension with blood pressure goal below 130/90, diabetes with hemoglobin A1c goal below 6.5% and cholesterol with LDL cholesterol (bad cholesterol) goal below 70 mg/dL. I also advised the patient to eat a healthy diet with plenty of whole grains, cereals, fruits and vegetables, exercise regularly and maintain ideal body weight.  Follow-up in 6 months or call earlier if needed   greater than 50% time during this 45 minute consultation visit was spent on counseling and coordination of care about his the corner infarct and restless leg syndrome.  George Hugh, AGNP-BC  [facility] [address] [place], [place] [address]  Phone [phone] Fax [phone] Note: This document was prepared with digital dictation and possible smart phrase technology. Any transcriptional errors that result from this process are unintentional.

## 2018-06-14 NOTE — Patient Instructions (Signed)
Continue aspirin 81 mg daily  and lipitor  for secondary stroke prevention  Continue to follow up with PCP regarding cholesterol and blood pressure management   Continue to stay active and maintain a healthy diet  Continue to monitor blood pressure at home  Maintain strict control of hypertension with blood pressure goal below 130/90, diabetes with hemoglobin A1c goal below 6.5% and cholesterol with LDL cholesterol (bad cholesterol) goal below 70 mg/dL. I also advised the patient to eat a healthy diet with plenty of whole grains, cereals, fruits and vegetables, exercise regularly and maintain ideal body weight.  Followup in the future with me in 6 months or call earlier if needed       Thank you for coming to see us at [facility]. I hope we have been able to provide you high quality care today.  You may receive a patient satisfaction survey over the next few weeks. We would appreciate your feedback and comments so that we may continue to improve ourselves and the health of our patients.  

## 2018-06-15 NOTE — Progress Notes (Signed)
I agree with the above plan 

## 2018-09-30 ENCOUNTER — Telehealth: Payer: Self-pay | Admitting: Adult Health

## 2018-09-30 NOTE — Telephone Encounter (Signed)
Please advise like to follow with his PCP or ENT provider in regards to this dizziness.  No reports of dizziness at prior visit which was in 05/2018.  He does have recent history of ear infection and rupture and he should be assessed to see if this could be causing his dizziness versus blood pressure, dehydration or a different modality

## 2018-09-30 NOTE — Telephone Encounter (Signed)
Spoke to wife and relayed the recommendations per Shanda Bumps as per below, as well has if symptoms worsen to seek care at ED or call 911. She verbalized understanding. I had not received notes per pcp.as yet.  She would  Keep [place] updated.

## 2018-09-30 NOTE — Telephone Encounter (Signed)
As BP was elevated with medication adjustments made, I would recommend on seeing how he does over the weekend. Please advise to continue to monitor BP at home to ensure adequate levels, ensure plenty of fluids, and adequate nutrition. If symptoms worsen, proceed to ED for further evaluation. I will review PCP notes on Monday if available.

## 2018-09-30 NOTE — Telephone Encounter (Addendum)
I called wife of pt.  She stated that pt did see pcp this am.  Experiencing dizziness, not everyday, (twice weekly) , Had elevated Bp 176/70 at pcp.  Pt on 2 meds for Bp changed one to bid for 5 days.  Also gave antivert to try. Pcp told to pt to contact neurology.  Wife works as Lawyer for [employer].    She will have Dr. Mauri Brooklyn , pcp to fax notes to [place].  Then would like recommendations from [place].

## 2018-09-30 NOTE — Telephone Encounter (Signed)
Pt wife(on DPR) is asking for a call to discuss dizziness pt has been experiencing for about a month.  Wife states its generally in the morning.  Wife states there is sometimes nausea with the dizziness. Please call

## 2018-10-04 NOTE — Telephone Encounter (Signed)
Samuel Moody from Dr. Levora Angel called wanting an up date on the earlier appt. request that she has sent. She can be reached at [phone]. Closed for lunch from 12-1

## 2018-10-04 NOTE — Telephone Encounter (Signed)
Received office visit fax from his PCP Dr. Levora Angel from office visit on 09/30/2018.  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  Per review of office notes, dizziness occurs when waking up in the a.m. sitting on the side of his bed with a sensation as though he wants to fall backwards which lasts less than a minute and sensation of everything wanting to go to the right.  Denies headache, weakness or N/V.  Once dizzy spell resolves, everything goes back to normal and is able to maintain daily activities.  BP elevated with orthostatic showing laying 175/80, sitting 180/85 and standing 180/90.  Antihypertensives adjusted.  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  He was last evaluated in this office on 06/14/2018 for stroke follow-up with initial visit by Dr. Pearlean Brownie on 02/25/2017.  No concerns or complaints at that time of dizziness sensation.  During admission on 01/04/2018 for stroke work-up, CTA showed patent carotid and vertebral arteries and patent anterior and posterior intracranial circulation.  MRI at that time did show left basal ganglia lacunar infarct.  Report of staggering gait and walking difficulty but denied dizziness or lightheadedness symptoms at that time.  We will reach out to Dr. Pearlean Brownie in regards to further evaluation with potential need of imaging to rule out potential posterior stroke causing vertigo sensation vs artery insufficiency. It will also be discussed whether Dr. Pearlean Brownie would like to evaluate him or if he would prefer he be scheduled with me via telephone visit or WebEx visit for evaluation.

## 2018-10-04 NOTE — Telephone Encounter (Signed)
Received faxed notes from Dr. Payton Spark.  Placed in JV/NP in box.  (noted that thinks needs to be checked for basilar artery insufficiency).

## 2018-10-05 NOTE — Telephone Encounter (Signed)
I called and spoke to Selena Batten at Dr. Lawerance Bach office.  They went ahead and ordered TCD and Carotid US on pt and results are pending.  They will see what results are and then depending on this will make referral. She appreciated call back. I relayed that JV/NP did review notes and sent message to Dr.Sethi who is out of office this week.  She appreciated call back.

## 2018-10-06 NOTE — Telephone Encounter (Signed)
Received fax from Dr. Levora Angel MD [phone], fx [phone]. Carotid 10-04-18 and TCD  10-05-18 doppler results.  Placed copy to be scanned and also to inbox PS/MD.

## 2018-10-06 NOTE — Telephone Encounter (Signed)
Noted. Thank you for following up on this Samuel Moody

## 2018-10-21 NOTE — Telephone Encounter (Signed)
Imaging results were apparently sent to Dr. Marlis Edelson inbox.  Can you please have him follow-up on this? Thank you

## 2018-10-21 NOTE — Telephone Encounter (Signed)
I called Kim RN at Dr .Levora Angel office. Selena Batten stated they fax over TCD and Carotid doppler results two weeks ago.I stated Dr Pearlean Brownie does not have the results in his inbasket in his office. She reported the pt is having dizzy spells and they wanted her check for basilar artery insufficiency. I requested to refax again at [phone]. Kim verbalized understanding.I stated Dr.Sethi will review again. Kim verbalized understanding.

## 2018-10-21 NOTE — Telephone Encounter (Signed)
Kim Dr. Lawerance Bach nurse called in and wants to know if patients appt can be moved up to a sooner date than scheduled

## 2018-10-21 NOTE — Telephone Encounter (Signed)
I have personally reviewed the report of transcranial Doppler study done at [facility] at [facility] on 10/05/2018 which shows essentially normal mean flow velocities in most of the identified vessels of anterior and posterior circulation.  Vertebral artery was identified only on the right side and had normal flow.  Dynamic testing for vertebral basilar insufficiency was not performed.  Carotid ultrasound done on 10/04/2018 was normal without significant extracranial stenosis on either side.

## 2018-10-25 NOTE — Telephone Encounter (Signed)
Per Dr.Sethi pt can be schedule with him or Shanda Bumps NP for a sooner appt.

## 2018-10-25 NOTE — Telephone Encounter (Signed)
I spoke with Selena Batten RN at pts pcp office. I stated Dr. Pearlean Brownie reviewed the TCD, and Carotid doppler that was fax over. I stated an app can be made with Dr. Pearlean Brownie next week. Urban stated an appt can be made with her. I schedule her with Dr. Pearlean Brownie on 11/04/2018 at 0100pm. I stated it will be a video visit due to COVID 19 we are not doing in office visits at this time. Selena Batten stated the pt works down [employer] from the office and will notify her. I stated if pt has a I- phone Dr Pearlean Brownie can facetime her but we will need her consent. Selena Batten will notify the pt if the time and date is okay. Pt has an appt with Shanda Bumps NP in June 2020 it will be not be cancel unless pt cannot do visit on 11/04/2018.

## 2018-10-27 NOTE — Telephone Encounter (Signed)
I called pts wife Olegario Messier about his appt with Dr. Pearlean Brownie next week. Olegario Messier stated her husband does not have a cell phone at all. She reported he is doing well and not having any dizzy spells. I explain to Olegario Messier that Dr.Sethi has the TCD and Carotid doppler report and has noted it in pts chart. I stated we were told this was a urgent visit. Olegario Messier stated she will call [facility] for Dr. Levora Angel to let them know he will just wait till June to see Shanda Bumps NP.She will call back if he starts having dizzy spells again.

## 2018-10-27 NOTE — Telephone Encounter (Signed)
Pt wife has called asking to speak with RN Katrina.  Wife has called to inform pt does not have a cell phone.  Wife has a cell phone but she works and is not home with pt.  Wife is asking for a call from RN Katrina

## 2018-11-02 ENCOUNTER — Ambulatory Visit: Payer: Self-pay | Admitting: Neurology

## 2018-12-08 ENCOUNTER — Telehealth: Payer: Self-pay

## 2018-12-08 NOTE — Telephone Encounter (Signed)
Spoke with the patient and tried to offer him a virtual visit he declined. He stated that he would like his appt cancelled due to him feeling fine. He stated that he would call us when he needed [place]. Appt has been cancelled.

## 2018-12-13 ENCOUNTER — Ambulatory Visit: Payer: Medicare Other | Admitting: Adult Health
